# Patient Record
Sex: Male | Born: 1990 | Race: White | Hispanic: No | Marital: Single | Smoking: Current every day smoker
Health system: Southern US, Community
[De-identification: ages and names within clinical notes are randomized; demographics above are authoritative.]

## PROBLEM LIST (undated history)

## (undated) DIAGNOSIS — F419 Anxiety disorder, unspecified: Secondary | ICD-10-CM

## (undated) DIAGNOSIS — J45909 Unspecified asthma, uncomplicated: Secondary | ICD-10-CM

## (undated) DIAGNOSIS — S7290XA Unspecified fracture of unspecified femur, initial encounter for closed fracture: Secondary | ICD-10-CM

---

## 2013-06-18 ENCOUNTER — Emergency Department (HOSPITAL_COMMUNITY): Payer: 59

## 2013-06-18 ENCOUNTER — Encounter (HOSPITAL_COMMUNITY): Admission: EM | Disposition: A | Discharge status: Home / Self Care | Payer: Self-pay | Source: Home / Self Care

## 2013-06-18 ENCOUNTER — Inpatient Hospital Stay (HOSPITAL_COMMUNITY): Payer: 59 | Admitting: Anesthesiology

## 2013-06-18 ENCOUNTER — Encounter (HOSPITAL_COMMUNITY): Payer: 59 | Admitting: Anesthesiology

## 2013-06-18 ENCOUNTER — Inpatient Hospital Stay (HOSPITAL_COMMUNITY): Payer: 59

## 2013-06-18 ENCOUNTER — Inpatient Hospital Stay (HOSPITAL_COMMUNITY)
Admission: EM | Admit: 2013-06-18 | Discharge: 2013-06-20 | DRG: 956 | Disposition: A | Discharge status: Home / Self Care | Payer: 59 | Attending: General Surgery | Admitting: General Surgery

## 2013-06-18 ENCOUNTER — Encounter (HOSPITAL_COMMUNITY): Payer: Self-pay | Admitting: Emergency Medicine

## 2013-06-18 DIAGNOSIS — S82209A Unspecified fracture of shaft of unspecified tibia, initial encounter for closed fracture: Secondary | ICD-10-CM | POA: Diagnosis present

## 2013-06-18 DIAGNOSIS — S7290XA Unspecified fracture of unspecified femur, initial encounter for closed fracture: Secondary | ICD-10-CM

## 2013-06-18 DIAGNOSIS — D62 Acute posthemorrhagic anemia: Secondary | ICD-10-CM | POA: Diagnosis not present

## 2013-06-18 DIAGNOSIS — S060X9A Concussion with loss of consciousness of unspecified duration, initial encounter: Secondary | ICD-10-CM | POA: Diagnosis present

## 2013-06-18 DIAGNOSIS — S060XAA Concussion with loss of consciousness status unknown, initial encounter: Secondary | ICD-10-CM | POA: Diagnosis present

## 2013-06-18 DIAGNOSIS — F1010 Alcohol abuse, uncomplicated: Secondary | ICD-10-CM | POA: Diagnosis present

## 2013-06-18 DIAGNOSIS — S27329A Contusion of lung, unspecified, initial encounter: Secondary | ICD-10-CM | POA: Diagnosis present

## 2013-06-18 DIAGNOSIS — M79609 Pain in unspecified limb: Secondary | ICD-10-CM | POA: Diagnosis present

## 2013-06-18 DIAGNOSIS — S0100XA Unspecified open wound of scalp, initial encounter: Secondary | ICD-10-CM | POA: Diagnosis present

## 2013-06-18 DIAGNOSIS — S0083XA Contusion of other part of head, initial encounter: Secondary | ICD-10-CM | POA: Diagnosis present

## 2013-06-18 DIAGNOSIS — S7292XA Unspecified fracture of left femur, initial encounter for closed fracture: Secondary | ICD-10-CM | POA: Diagnosis present

## 2013-06-18 DIAGNOSIS — S0003XA Contusion of scalp, initial encounter: Secondary | ICD-10-CM | POA: Diagnosis present

## 2013-06-18 DIAGNOSIS — S1093XA Contusion of unspecified part of neck, initial encounter: Secondary | ICD-10-CM

## 2013-06-18 DIAGNOSIS — F10929 Alcohol use, unspecified with intoxication, unspecified: Secondary | ICD-10-CM | POA: Diagnosis present

## 2013-06-18 DIAGNOSIS — IMO0002 Reserved for concepts with insufficient information to code with codable children: Secondary | ICD-10-CM | POA: Diagnosis present

## 2013-06-18 DIAGNOSIS — S27322A Contusion of lung, bilateral, initial encounter: Secondary | ICD-10-CM | POA: Diagnosis present

## 2013-06-18 DIAGNOSIS — S0081XA Abrasion of other part of head, initial encounter: Secondary | ICD-10-CM | POA: Diagnosis present

## 2013-06-18 HISTORY — DX: Anxiety disorder, unspecified: F41.9

## 2013-06-18 HISTORY — DX: Unspecified fracture of unspecified femur, initial encounter for closed fracture: S72.90XA

## 2013-06-18 HISTORY — DX: Unspecified asthma, uncomplicated: J45.909

## 2013-06-18 HISTORY — PX: INTRAMEDULLARY (IM) NAIL INTERTROCHANTERIC: SHX5875

## 2013-06-18 LAB — URINALYSIS, ROUTINE W REFLEX MICROSCOPIC
Bilirubin Urine: NEGATIVE
GLUCOSE, UA: NEGATIVE mg/dL
Ketones, ur: NEGATIVE mg/dL
Leukocytes, UA: NEGATIVE
Nitrite: NEGATIVE
Protein, ur: 100 mg/dL — AB
Specific Gravity, Urine: 1.02 (ref 1.005–1.030)
Urobilinogen, UA: 0.2 mg/dL (ref 0.0–1.0)
pH: 6 (ref 5.0–8.0)

## 2013-06-18 LAB — I-STAT CHEM 8, ED
BUN: 14 mg/dL (ref 6–23)
CREATININE: 1.6 mg/dL — AB (ref 0.50–1.35)
Calcium, Ion: 1.08 mmol/L — ABNORMAL LOW (ref 1.12–1.23)
Chloride: 102 mEq/L (ref 96–112)
GLUCOSE: 95 mg/dL (ref 70–99)
HCT: 48 % (ref 39.0–52.0)
HEMOGLOBIN: 16.3 g/dL (ref 13.0–17.0)
Potassium: 3.2 mEq/L — ABNORMAL LOW (ref 3.7–5.3)
Sodium: 142 mEq/L (ref 137–147)
TCO2: 25 mmol/L (ref 0–100)

## 2013-06-18 LAB — TYPE AND SCREEN
ABO/RH(D): A POS
Antibody Screen: NEGATIVE

## 2013-06-18 LAB — ABO/RH: ABO/RH(D): A POS

## 2013-06-18 LAB — SURGICAL PCR SCREEN
MRSA, PCR: NEGATIVE
Staphylococcus aureus: NEGATIVE

## 2013-06-18 LAB — COMPREHENSIVE METABOLIC PANEL
ALK PHOS: 72 U/L (ref 39–117)
ALT: 100 U/L — ABNORMAL HIGH (ref 0–53)
AST: 160 U/L — ABNORMAL HIGH (ref 0–37)
Albumin: 4 g/dL (ref 3.5–5.2)
BILIRUBIN TOTAL: 0.3 mg/dL (ref 0.3–1.2)
BUN: 13 mg/dL (ref 6–23)
CHLORIDE: 103 meq/L (ref 96–112)
CO2: 23 meq/L (ref 19–32)
Calcium: 8.7 mg/dL (ref 8.4–10.5)
Creatinine, Ser: 1.1 mg/dL (ref 0.50–1.35)
Glucose, Bld: 88 mg/dL (ref 70–99)
Potassium: 3.5 mEq/L — ABNORMAL LOW (ref 3.7–5.3)
Sodium: 143 mEq/L (ref 137–147)
TOTAL PROTEIN: 6.9 g/dL (ref 6.0–8.3)

## 2013-06-18 LAB — CBC
HCT: 43.4 % (ref 39.0–52.0)
HCT: 33.4 % — ABNORMAL LOW (ref 39.0–52.0)
HEMOGLOBIN: 11.4 g/dL — AB (ref 13.0–17.0)
Hemoglobin: 15.2 g/dL (ref 13.0–17.0)
MCH: 32 pg (ref 26.0–34.0)
MCH: 31.1 pg (ref 26.0–34.0)
MCHC: 35 g/dL (ref 30.0–36.0)
MCHC: 34.1 g/dL (ref 30.0–36.0)
MCV: 91.4 fL (ref 78.0–100.0)
MCV: 91.3 fL (ref 78.0–100.0)
Platelets: 237 10*3/uL (ref 150–400)
Platelets: 163 10*3/uL (ref 150–400)
RBC: 4.75 MIL/uL (ref 4.22–5.81)
RBC: 3.66 MIL/uL — AB (ref 4.22–5.81)
RDW: 11.8 % (ref 11.5–15.5)
RDW: 12 % (ref 11.5–15.5)
WBC: 15.8 10*3/uL — ABNORMAL HIGH (ref 4.0–10.5)
WBC: 8.3 10*3/uL (ref 4.0–10.5)

## 2013-06-18 LAB — URINE MICROSCOPIC-ADD ON

## 2013-06-18 LAB — PROTIME-INR
INR: 0.99 (ref 0.00–1.49)
Prothrombin Time: 12.9 seconds (ref 11.6–15.2)

## 2013-06-18 LAB — CREATININE, SERUM
CREATININE: 0.78 mg/dL (ref 0.50–1.35)
GFR calc non Af Amer: >90 mL/min (ref >90)

## 2013-06-18 LAB — ETHANOL: Alcohol, Ethyl (B): 309 mg/dL — ABNORMAL HIGH (ref 0–11)

## 2013-06-18 LAB — CDS SEROLOGY

## 2013-06-18 SURGERY — FIXATION, FRACTURE, INTERTROCHANTERIC, WITH INTRAMEDULLARY ROD
Anesthesia: General | Site: Leg Upper | Laterality: Left

## 2013-06-18 MED ORDER — GLYCOPYRROLATE 0.2 MG/ML IJ SOLN
INTRAMUSCULAR | Status: DC | PRN
  Administered 2013-06-18: 0.6 mg via INTRAVENOUS

## 2013-06-18 MED ORDER — ALBUTEROL SULFATE (2.5 MG/3ML) 0.083% IN NEBU: 3.0000 mL | INHALATION_SOLUTION | Freq: Four times a day (QID) | RESPIRATORY_TRACT | Status: DC | PRN

## 2013-06-18 MED ORDER — FENTANYL CITRATE 0.05 MG/ML IJ SOLN
INTRAMUSCULAR | Status: AC
  Filled 2013-06-18: qty 5

## 2013-06-18 MED ORDER — ONDANSETRON HCL 4 MG/2ML IJ SOLN
INTRAMUSCULAR | Status: DC | PRN
  Administered 2013-06-18: 4 mg via INTRAVENOUS

## 2013-06-18 MED ORDER — HYDROMORPHONE HCL PF 1 MG/ML IJ SOLN
1.0000 mg | Freq: Once | INTRAMUSCULAR | Status: AC
  Administered 2013-06-18: 1 mg via INTRAVENOUS
  Filled 2013-06-18: qty 1

## 2013-06-18 MED ORDER — SODIUM CHLORIDE 0.9 % IV SOLN
INTRAVENOUS | Status: DC
  Administered 2013-06-18: 18:00:00 via INTRAVENOUS

## 2013-06-18 MED ORDER — NEOSTIGMINE METHYLSULFATE 1 MG/ML IJ SOLN
INTRAMUSCULAR | Status: AC
  Filled 2013-06-18: qty 10

## 2013-06-18 MED ORDER — TETANUS-DIPHTHERIA TOXOIDS TD 5-2 LFU IM INJ: 0.5000 mL | INJECTION | Freq: Once | INTRAMUSCULAR | Status: DC

## 2013-06-18 MED ORDER — ALBUMIN HUMAN 5 % IV SOLN
INTRAVENOUS | Status: DC | PRN
  Administered 2013-06-18 (×2): via INTRAVENOUS

## 2013-06-18 MED ORDER — LORAZEPAM 2 MG/ML IJ SOLN: 1.0000 mg | Freq: Four times a day (QID) | INTRAMUSCULAR | Status: DC | PRN

## 2013-06-18 MED ORDER — DIPHENHYDRAMINE HCL 12.5 MG/5ML PO ELIX: 25.0000 mg | ORAL_SOLUTION | ORAL | Status: DC | PRN

## 2013-06-18 MED ORDER — GLYCOPYRROLATE 0.2 MG/ML IJ SOLN
INTRAMUSCULAR | Status: AC
  Filled 2013-06-18: qty 3

## 2013-06-18 MED ORDER — POLYETHYLENE GLYCOL 3350 17 G PO PACK: 17.0000 g | PACK | Freq: Every day | ORAL | Status: DC | PRN

## 2013-06-18 MED ORDER — LORAZEPAM 2 MG/ML IJ SOLN: 0.0000 mg | Freq: Four times a day (QID) | INTRAMUSCULAR | Status: DC

## 2013-06-18 MED ORDER — CEFAZOLIN SODIUM-DEXTROSE 2-3 GM-% IV SOLR
INTRAVENOUS | Status: DC | PRN
  Administered 2013-06-18: 2 g via INTRAVENOUS

## 2013-06-18 MED ORDER — OXYCODONE HCL 5 MG PO TABS
5.0000 mg | ORAL_TABLET | Freq: Once | ORAL | Status: AC | PRN
  Administered 2013-06-18: 5 mg via ORAL

## 2013-06-18 MED ORDER — ONDANSETRON HCL 4 MG/2ML IJ SOLN: 4.0000 mg | Freq: Four times a day (QID) | INTRAMUSCULAR | Status: DC | PRN

## 2013-06-18 MED ORDER — LORAZEPAM 2 MG/ML IJ SOLN: 0.0000 mg | Freq: Two times a day (BID) | INTRAMUSCULAR | Status: DC

## 2013-06-18 MED ORDER — OXYCODONE HCL 5 MG/5ML PO SOLN: 5.0000 mg | Freq: Once | ORAL | Status: AC | PRN

## 2013-06-18 MED ORDER — METHOCARBAMOL 100 MG/ML IJ SOLN
500.0000 mg | Freq: Four times a day (QID) | INTRAVENOUS | Status: DC | PRN
  Filled 2013-06-18: qty 5

## 2013-06-18 MED ORDER — HYDROMORPHONE HCL PF 1 MG/ML IJ SOLN
INTRAMUSCULAR | Status: AC
  Filled 2013-06-18: qty 1

## 2013-06-18 MED ORDER — METOCLOPRAMIDE HCL 5 MG PO TABS
5.0000 mg | ORAL_TABLET | Freq: Three times a day (TID) | ORAL | Status: DC | PRN
  Filled 2013-06-18: qty 2

## 2013-06-18 MED ORDER — ENOXAPARIN SODIUM 40 MG/0.4ML ~~LOC~~ SOLN
40.0000 mg | SUBCUTANEOUS | Status: DC
  Administered 2013-06-18: 40 mg via SUBCUTANEOUS
  Filled 2013-06-18 (×3): qty 0.4

## 2013-06-18 MED ORDER — 0.9 % SODIUM CHLORIDE (POUR BTL) OPTIME
TOPICAL | Status: DC | PRN
  Administered 2013-06-18: 1000 mL

## 2013-06-18 MED ORDER — ROCURONIUM BROMIDE 50 MG/5ML IV SOLN
INTRAVENOUS | Status: AC
  Filled 2013-06-18: qty 1

## 2013-06-18 MED ORDER — MORPHINE SULFATE 2 MG/ML IJ SOLN
1.0000 mg | INTRAMUSCULAR | Status: DC | PRN
  Administered 2013-06-18 – 2013-06-19 (×4): 1 mg via INTRAVENOUS
  Filled 2013-06-18 (×4): qty 1

## 2013-06-18 MED ORDER — OXYCODONE HCL 5 MG PO TABS
5.0000 mg | ORAL_TABLET | ORAL | Status: DC | PRN
Start: 2013-06-18 — End: 2013-06-19
  Administered 2013-06-18 – 2013-06-19 (×2): 10 mg via ORAL
  Filled 2013-06-18 (×2): qty 2

## 2013-06-18 MED ORDER — ENOXAPARIN SODIUM 40 MG/0.4ML ~~LOC~~ SOLN: 40.0000 mg | Freq: Every day | SUBCUTANEOUS | Status: DC

## 2013-06-18 MED ORDER — ROCURONIUM BROMIDE 100 MG/10ML IV SOLN
INTRAVENOUS | Status: DC | PRN
  Administered 2013-06-18: 50 mg via INTRAVENOUS
  Administered 2013-06-18: 20 mg via INTRAVENOUS

## 2013-06-18 MED ORDER — ONDANSETRON HCL 4 MG/2ML IJ SOLN
INTRAMUSCULAR | Status: AC
  Filled 2013-06-18: qty 2

## 2013-06-18 MED ORDER — FENTANYL CITRATE 0.05 MG/ML IJ SOLN
INTRAMUSCULAR | Status: DC | PRN
  Administered 2013-06-18 (×5): 50 ug via INTRAVENOUS

## 2013-06-18 MED ORDER — METHOCARBAMOL 500 MG PO TABS
500.0000 mg | ORAL_TABLET | Freq: Four times a day (QID) | ORAL | Status: DC | PRN
Start: 2013-06-18 — End: 2013-06-20
  Administered 2013-06-18 – 2013-06-20 (×6): 500 mg via ORAL
  Filled 2013-06-18 (×6): qty 1

## 2013-06-18 MED ORDER — PANTOPRAZOLE SODIUM 40 MG IV SOLR
40.0000 mg | Freq: Every day | INTRAVENOUS | Status: DC
  Administered 2013-06-18: 40 mg via INTRAVENOUS
  Filled 2013-06-18 (×2): qty 40

## 2013-06-18 MED ORDER — NEOSTIGMINE METHYLSULFATE 1 MG/ML IJ SOLN
INTRAMUSCULAR | Status: DC | PRN
  Administered 2013-06-18: 4 mg via INTRAVENOUS

## 2013-06-18 MED ORDER — PROMETHAZINE HCL 25 MG/ML IJ SOLN: 6.2500 mg | INTRAMUSCULAR | Status: DC | PRN

## 2013-06-18 MED ORDER — LACTATED RINGERS IV SOLN
INTRAVENOUS | Status: DC | PRN
  Administered 2013-06-18 (×2): via INTRAVENOUS

## 2013-06-18 MED ORDER — METOCLOPRAMIDE HCL 5 MG/ML IJ SOLN: 5.0000 mg | Freq: Three times a day (TID) | INTRAMUSCULAR | Status: DC | PRN

## 2013-06-18 MED ORDER — MIDAZOLAM HCL 2 MG/2ML IJ SOLN
INTRAMUSCULAR | Status: AC
  Filled 2013-06-18: qty 2

## 2013-06-18 MED ORDER — ONDANSETRON HCL 4 MG/2ML IJ SOLN
INTRAMUSCULAR | Status: AC
  Administered 2013-06-18: 4 mg
  Filled 2013-06-18: qty 2

## 2013-06-18 MED ORDER — SENNA 8.6 MG PO TABS
1.0000 | ORAL_TABLET | Freq: Two times a day (BID) | ORAL | Status: DC
  Administered 2013-06-18 – 2013-06-20 (×4): 8.6 mg via ORAL
  Filled 2013-06-18 (×5): qty 1

## 2013-06-18 MED ORDER — PANTOPRAZOLE SODIUM 40 MG PO TBEC: 40.0000 mg | DELAYED_RELEASE_TABLET | Freq: Every day | ORAL | Status: DC

## 2013-06-18 MED ORDER — ONDANSETRON HCL 4 MG PO TABS: 4.0000 mg | ORAL_TABLET | Freq: Four times a day (QID) | ORAL | Status: DC | PRN

## 2013-06-18 MED ORDER — IOHEXOL 300 MG/ML  SOLN
100.0000 mL | Freq: Once | INTRAMUSCULAR | Status: AC | PRN
  Administered 2013-06-18: 100 mL via INTRAVENOUS

## 2013-06-18 MED ORDER — HYDROCODONE-ACETAMINOPHEN 5-325 MG PO TABS: 1.0000 | ORAL_TABLET | ORAL | Status: DC | PRN

## 2013-06-18 MED ORDER — FOLIC ACID 1 MG PO TABS
1.0000 mg | ORAL_TABLET | Freq: Every day | ORAL | Status: DC
  Administered 2013-06-19 – 2013-06-20 (×2): 1 mg via ORAL
  Filled 2013-06-18 (×3): qty 1

## 2013-06-18 MED ORDER — LIDOCAINE HCL (CARDIAC) 20 MG/ML IV SOLN
INTRAVENOUS | Status: DC | PRN
  Administered 2013-06-18: 100 mg via INTRAVENOUS

## 2013-06-18 MED ORDER — PROPOFOL 10 MG/ML IV BOLUS
INTRAVENOUS | Status: AC
Start: 2013-06-18 — End: 2013-06-18
  Filled 2013-06-18: qty 20

## 2013-06-18 MED ORDER — OXYCODONE HCL 5 MG PO TABS: 5.0000 mg | ORAL_TABLET | ORAL | Status: DC | PRN

## 2013-06-18 MED ORDER — CLONAZEPAM 0.5 MG PO TABS: 0.5000 mg | ORAL_TABLET | Freq: Three times a day (TID) | ORAL | Status: DC | PRN

## 2013-06-18 MED ORDER — PROPOFOL 10 MG/ML IV BOLUS
INTRAVENOUS | Status: DC | PRN
  Administered 2013-06-18: 130 mg via INTRAVENOUS

## 2013-06-18 MED ORDER — FENTANYL CITRATE 0.05 MG/ML IJ SOLN
50.0000 ug | Freq: Once | INTRAMUSCULAR | Status: AC
  Administered 2013-06-18: 50 ug via INTRAVENOUS

## 2013-06-18 MED ORDER — OXYCODONE HCL 5 MG PO TABS
ORAL_TABLET | ORAL | Status: AC
  Filled 2013-06-18: qty 1

## 2013-06-18 MED ORDER — HYDROMORPHONE HCL PF 1 MG/ML IJ SOLN
1.0000 mg | INTRAMUSCULAR | Status: DC | PRN
  Administered 2013-06-18 (×2): 1 mg via INTRAVENOUS
  Filled 2013-06-18: qty 1
  Filled 2013-06-18: qty 2

## 2013-06-18 MED ORDER — FENTANYL CITRATE 0.05 MG/ML IJ SOLN
INTRAMUSCULAR | Status: AC
  Administered 2013-06-18: 50 ug via INTRAVENOUS
  Filled 2013-06-18: qty 2

## 2013-06-18 MED ORDER — KCL IN DEXTROSE-NACL 20-5-0.45 MEQ/L-%-% IV SOLN
INTRAVENOUS | Status: DC
  Administered 2013-06-18: 11:00:00 via INTRAVENOUS
  Filled 2013-06-18 (×5): qty 1000

## 2013-06-18 MED ORDER — ADULT MULTIVITAMIN W/MINERALS CH
1.0000 | ORAL_TABLET | Freq: Every day | ORAL | Status: DC
  Administered 2013-06-19 – 2013-06-20 (×2): 1 via ORAL
  Filled 2013-06-18 (×3): qty 1

## 2013-06-18 MED ORDER — VITAMIN B-1 100 MG PO TABS
100.0000 mg | ORAL_TABLET | Freq: Every day | ORAL | Status: DC
  Administered 2013-06-19 – 2013-06-20 (×2): 100 mg via ORAL
  Filled 2013-06-18 (×3): qty 1

## 2013-06-18 MED ORDER — TETANUS-DIPHTHERIA TOXOIDS TD 5-2 LFU IM INJ
0.5000 mL | INJECTION | Freq: Once | INTRAMUSCULAR | Status: DC
  Filled 2013-06-18: qty 0.5

## 2013-06-18 MED ORDER — CEFAZOLIN SODIUM-DEXTROSE 2-3 GM-% IV SOLR
2.0000 g | Freq: Four times a day (QID) | INTRAVENOUS | Status: AC
  Administered 2013-06-18 – 2013-06-19 (×3): 2 g via INTRAVENOUS
  Filled 2013-06-18 (×4): qty 50

## 2013-06-18 MED ORDER — PROPOFOL 10 MG/ML IV BOLUS
INTRAVENOUS | Status: AC
  Filled 2013-06-18: qty 20

## 2013-06-18 MED ORDER — SODIUM CHLORIDE 0.9 % IV BOLUS (SEPSIS)
500.0000 mL | Freq: Once | INTRAVENOUS | Status: AC
  Administered 2013-06-18: 500 mL via INTRAVENOUS

## 2013-06-18 MED ORDER — SORBITOL 70 % SOLN: 30.0000 mL | Freq: Every day | Status: DC | PRN

## 2013-06-18 MED ORDER — LORAZEPAM 1 MG PO TABS
1.0000 mg | ORAL_TABLET | Freq: Four times a day (QID) | ORAL | Status: DC | PRN
  Administered 2013-06-20: 1 mg via ORAL
  Filled 2013-06-18: qty 1

## 2013-06-18 MED ORDER — HYDROMORPHONE HCL PF 1 MG/ML IJ SOLN
0.2500 mg | INTRAMUSCULAR | Status: DC | PRN
  Administered 2013-06-18 (×4): 0.5 mg via INTRAVENOUS

## 2013-06-18 MED ORDER — MAGNESIUM CITRATE PO SOLN: 1.0000 | Freq: Once | ORAL | Status: AC | PRN

## 2013-06-18 MED ORDER — DIAZEPAM 5 MG PO TABS
5.0000 mg | ORAL_TABLET | Freq: Every day | ORAL | Status: DC | PRN
  Administered 2013-06-18: 5 mg via ORAL
  Filled 2013-06-18: qty 1

## 2013-06-18 MED ORDER — MIDAZOLAM HCL 5 MG/5ML IJ SOLN
INTRAMUSCULAR | Status: DC | PRN
  Administered 2013-06-18: 2 mg via INTRAVENOUS

## 2013-06-18 MED ORDER — THIAMINE HCL 100 MG/ML IJ SOLN
100.0000 mg | Freq: Every day | INTRAMUSCULAR | Status: DC
  Administered 2013-06-18: 100 mg via INTRAVENOUS
  Filled 2013-06-18 (×2): qty 1

## 2013-06-18 MED ORDER — TETANUS-DIPHTH-ACELL PERTUSSIS 5-2.5-18.5 LF-MCG/0.5 IM SUSP
0.5000 mL | Freq: Once | INTRAMUSCULAR | Status: AC
  Administered 2013-06-18: 0.5 mL via INTRAMUSCULAR

## 2013-06-18 SURGICAL SUPPLY — 47 items
BANDAGE GAUZE ELAST BULKY 4 IN (GAUZE/BANDAGES/DRESSINGS) ×3 IMPLANT
BIT DRILL SHORT 4.0 (BIT) ×2 IMPLANT
BLADE SURG 15 STRL LF DISP TIS (BLADE) ×1 IMPLANT
BLADE SURG 15 STRL SS (BLADE) ×2
BNDG COHESIVE 4X5 TAN NS LF (GAUZE/BANDAGES/DRESSINGS) ×3 IMPLANT
COVER SURGICAL LIGHT HANDLE (MISCELLANEOUS) ×3 IMPLANT
DRAPE PROXIMA HALF (DRAPES) IMPLANT
DRAPE STERI IOBAN 125X83 (DRAPES) ×3 IMPLANT
DRILL BIT SHORT 4.0 (BIT) ×4
DRSG MEPILEX BORDER 4X4 (GAUZE/BANDAGES/DRESSINGS) ×6 IMPLANT
DRSG MEPILEX BORDER 4X8 (GAUZE/BANDAGES/DRESSINGS) ×3 IMPLANT
DRSG PAD ABDOMINAL 8X10 ST (GAUZE/BANDAGES/DRESSINGS) ×6 IMPLANT
DURAPREP 26ML APPLICATOR (WOUND CARE) ×3 IMPLANT
ELECT CAUTERY BLADE 6.4 (BLADE) ×3 IMPLANT
ELECT REM PT RETURN 9FT ADLT (ELECTROSURGICAL) ×3
ELECTRODE REM PT RTRN 9FT ADLT (ELECTROSURGICAL) ×1 IMPLANT
FACESHIELD WRAPAROUND (MASK) ×3 IMPLANT
GAUZE XEROFORM 5X9 LF (GAUZE/BANDAGES/DRESSINGS) ×3 IMPLANT
GLOVE SURG SS PI 7.5 STRL IVOR (GLOVE) ×6 IMPLANT
GOWN STRL REUS W/ TWL LRG LVL3 (GOWN DISPOSABLE) ×1 IMPLANT
GOWN STRL REUS W/ TWL XL LVL3 (GOWN DISPOSABLE) ×1 IMPLANT
GOWN STRL REUS W/TWL LRG LVL3 (GOWN DISPOSABLE) ×2
GOWN STRL REUS W/TWL XL LVL3 (GOWN DISPOSABLE) ×2
GUIDE PIN 3.2MM (MISCELLANEOUS) ×2
GUIDE PIN ORTH 343X3.2XBRAD (MISCELLANEOUS) ×1 IMPLANT
GUIDE ROD 3.0 (MISCELLANEOUS) ×3
KIT BASIN OR (CUSTOM PROCEDURE TRAY) ×3 IMPLANT
KIT ROOM TURNOVER OR (KITS) ×3 IMPLANT
MANIFOLD NEPTUNE II (INSTRUMENTS) ×3 IMPLANT
NS IRRIG 1000ML POUR BTL (IV SOLUTION) ×3 IMPLANT
PACK GENERAL/GYN (CUSTOM PROCEDURE TRAY) ×3 IMPLANT
PAD ARMBOARD 7.5X6 YLW CONV (MISCELLANEOUS) ×6 IMPLANT
PAD CAST 4YDX4 CTTN HI CHSV (CAST SUPPLIES) ×2 IMPLANT
PADDING CAST COTTON 4X4 STRL (CAST SUPPLIES) ×4
ROD GUIDE 3.0 (MISCELLANEOUS) ×1 IMPLANT
SCREW TRIGEN LOW PROF 5.0X50 (Screw) ×3 IMPLANT
SCREW TRIGEN LOW PROF 5.0X65 (Screw) ×3 IMPLANT
SCREW TRIGEN LOW PROF 5.0X72.5 (Screw) ×3 IMPLANT
STAPLER VISISTAT 35W (STAPLE) ×3 IMPLANT
SUT VIC AB 0 CT1 27 (SUTURE) ×4
SUT VIC AB 0 CT1 27XBRD ANBCTR (SUTURE) ×2 IMPLANT
SUT VIC AB 2-0 CT1 27 (SUTURE) ×4
SUT VIC AB 2-0 CT1 TAPERPNT 27 (SUTURE) ×2 IMPLANT
TOWEL OR 17X24 6PK STRL BLUE (TOWEL DISPOSABLE) ×3 IMPLANT
TOWEL OR 17X26 10 PK STRL BLUE (TOWEL DISPOSABLE) ×3 IMPLANT
TROCHNAIL ANTIGRADE 130 10X42 (Orthopedic Implant) ×3 IMPLANT
WATER STERILE IRR 1000ML POUR (IV SOLUTION) ×3 IMPLANT
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
## 2013-06-18 NOTE — Progress Notes (Signed)
Orthopedic Tech Progress Note Patient Details:  John BusingKevin Hudson 1991/02/08 [id number] Applied overhead frame to bed     John Moccasinnthony Hudson John Hudson 06/18/2013, 8:03 PM
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
## 2013-06-18 NOTE — Progress Notes (Signed)
Orthopedic Tech Progress Note Patient Details:  Hall BusingKevin Rochette 1990-04-28 [id number]      Haskell FlirtCorey M Lazar Tierce 06/18/2013, 5:46 AM

## 2013-06-18 NOTE — H&P (Signed)
H&P update  The surgical history has been reviewed and remains accurate without interval change.  The patient was re-examined and patient's physiologic condition has not changed significantly in the last 30 days. The condition still exists that makes this procedure necessary. The treatment plan remains the same, without new options for care.  No new pharmacological allergies or types of therapy has been initiated that would change the plan or the appropriateness of the plan.  The patient and/or family understand the potential benefits and risks.  Mayra ReelN. Michael Clova Morlock, MD 06/18/2013 12:18 PM
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
## 2013-06-18 NOTE — ED Notes (Signed)
WARM BLANKETS GIVEN TO PT.

## 2013-06-18 NOTE — Anesthesia Procedure Notes (Signed)
Procedure Name: Intubation Date/Time: 06/18/2013 1:58 PM Performed by: Brien MatesMAHONY, Shemeca Lukasik D Pre-anesthesia Checklist: Patient identified, Emergency Drugs available, Suction available, Patient being monitored and Timeout performed Patient Re-evaluated:Patient Re-evaluated prior to inductionOxygen Delivery Method: Circle system utilized Preoxygenation: Pre-oxygenation with 100% oxygen Intubation Type: IV induction Ventilation: Mask ventilation without difficulty Laryngoscope Size: Miller and 2 Grade View: Grade I Tube type: Oral Tube size: 7.5 mm Number of attempts: 1 Airway Equipment and Method: Stylet Placement Confirmation: ETT inserted through vocal cords under direct vision,  positive ETCO2 and breath sounds checked- equal and bilateral Secured at: 22 cm Tube secured with: Tape Dental Injury: Teeth and Oropharynx as per pre-operative assessment
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
## 2013-06-18 NOTE — ED Notes (Signed)
[facility]

## 2013-06-18 NOTE — Anesthesia Preprocedure Evaluation (Addendum)
Anesthesia Evaluation  Patient identified by MRN, date of birth, ID band Patient awake    Reviewed: Allergy & Precautions, H&P , NPO status , Patient's Chart, lab work & pertinent test results  Airway Mallampati: II TM Distance: >3 FB Neck ROM: full    Dental no notable dental hx. (+) Teeth Intact, Dental Advidsory Given   Pulmonary neg pulmonary ROS, asthma , Current Smoker,  breath sounds clear to auscultation        Cardiovascular negative cardio ROS  Rhythm:regular Rate:Normal     Neuro/Psych negative neurological ROS  negative psych ROS   GI/Hepatic negative GI ROS, Neg liver ROS,   Endo/Other  negative endocrine ROS  Renal/GU negative Renal ROS     Musculoskeletal   Abdominal   Peds  Hematology   Anesthesia Other Findings   Reproductive/Obstetrics negative OB ROS                          Anesthesia Physical Anesthesia Plan  ASA: II  Anesthesia Plan: General ETT   Post-op Pain Management:    Induction: Intravenous  Airway Management Planned: Oral ETT  Additional Equipment:   Intra-op Plan:   Post-operative Plan:   Informed Consent: I have reviewed the patients History and Physical, chart, labs and discussed the procedure including the risks, benefits and alternatives for the proposed anesthesia with the patient or authorized representative who has indicated his/her understanding and acceptance.   Dental Advisory Given and Dental advisory given  Plan Discussed with: Anesthesiologist, CRNA and Surgeon  Anesthesia Plan Comments:        Anesthesia Quick Evaluation

## 2013-06-18 NOTE — Progress Notes (Signed)
Orthopedic Tech Progress Note Patient Details:  Hall BusingKevin Repka 1990-04-21 [id number] Traction weight changed from 5lbs to 10lbs Musculoskeletal Traction Type of Traction: Bucks Skin Traction Traction Location: right leg Traction Weight: 10 lbs    [place] R Thompson 06/18/2013, 9:58 AM
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
## 2013-06-18 NOTE — Progress Notes (Signed)
UR completed.  Kayliegh Boyers, RN BSN [facility] CCM Trauma/Neuro ICU Case Manager [phone]  

## 2013-06-18 NOTE — ED Notes (Signed)
DR. Laurell JosephsBURKE THOMPSON AT BEDSIDE EVALUATING PT.

## 2013-06-18 NOTE — Transfer of Care (Signed)
Immediate Anesthesia Transfer of Care Note  Patient: John Hudson  Procedure(s) Performed: Procedure(s): INTRAMEDULLARY (IM) NAIL FEMORAL (Left)  Patient Location: PACU  Anesthesia Type:General  Level of Consciousness: awake  Airway & Oxygen Therapy: Patient Spontanous Breathing and Patient connected to face mask oxygen  Post-op Assessment: Report given to PACU RN and Post -op Vital signs reviewed and stable  Post vital signs: Reviewed and stable  Complications: No apparent anesthesia complications

## 2013-06-18 NOTE — ED Notes (Signed)
TRANSPORTED TO CT SCAN.  

## 2013-06-18 NOTE — Progress Notes (Signed)
Surgery scheduled for 1 pm today.

## 2013-06-19 ENCOUNTER — Inpatient Hospital Stay (HOSPITAL_COMMUNITY): Payer: 59

## 2013-06-19 ENCOUNTER — Encounter (HOSPITAL_COMMUNITY): Payer: 59 | Admitting: Anesthesiology

## 2013-06-19 ENCOUNTER — Encounter (HOSPITAL_COMMUNITY): Payer: Self-pay | Admitting: Certified Registered"

## 2013-06-19 ENCOUNTER — Encounter (HOSPITAL_COMMUNITY): Admission: EM | Disposition: A | Discharge status: Home / Self Care | Payer: Self-pay | Source: Home / Self Care

## 2013-06-19 ENCOUNTER — Inpatient Hospital Stay (HOSPITAL_COMMUNITY): Payer: 59 | Admitting: Anesthesiology

## 2013-06-19 DIAGNOSIS — S060X9A Concussion with loss of consciousness of unspecified duration, initial encounter: Secondary | ICD-10-CM | POA: Diagnosis present

## 2013-06-19 DIAGNOSIS — D62 Acute posthemorrhagic anemia: Secondary | ICD-10-CM | POA: Diagnosis not present

## 2013-06-19 DIAGNOSIS — S060XAA Concussion with loss of consciousness status unknown, initial encounter: Secondary | ICD-10-CM | POA: Diagnosis present

## 2013-06-19 DIAGNOSIS — S7290XA Unspecified fracture of unspecified femur, initial encounter for closed fracture: Secondary | ICD-10-CM | POA: Diagnosis not present

## 2013-06-19 HISTORY — PX: TIBIA IM NAIL INSERTION: SHX2516

## 2013-06-19 LAB — CBC
HCT: 31.4 % — ABNORMAL LOW (ref 39.0–52.0)
Hemoglobin: 10.8 g/dL — ABNORMAL LOW (ref 13.0–17.0)
MCH: 31.1 pg (ref 26.0–34.0)
MCHC: 34.4 g/dL (ref 30.0–36.0)
MCV: 90.5 fL (ref 78.0–100.0)
PLATELETS: 154 10*3/uL (ref 150–400)
RBC: 3.47 MIL/uL — ABNORMAL LOW (ref 4.22–5.81)
RDW: 11.9 % (ref 11.5–15.5)
WBC: 6.8 10*3/uL (ref 4.0–10.5)

## 2013-06-19 LAB — COMPREHENSIVE METABOLIC PANEL
ALT: 54 U/L — ABNORMAL HIGH (ref 0–53)
AST: 87 U/L — AB (ref 0–37)
Albumin: 3.4 g/dL — ABNORMAL LOW (ref 3.5–5.2)
Alkaline Phosphatase: 56 U/L (ref 39–117)
BUN: 10 mg/dL (ref 6–23)
CALCIUM: 8.7 mg/dL (ref 8.4–10.5)
CO2: 27 mEq/L (ref 19–32)
Chloride: 100 mEq/L (ref 96–112)
Creatinine, Ser: 0.89 mg/dL (ref 0.50–1.35)
GFR calc Af Amer: >90 mL/min (ref >90)
Glucose, Bld: 117 mg/dL — ABNORMAL HIGH (ref 70–99)
Potassium: 4.7 mEq/L (ref 3.7–5.3)
Sodium: 139 mEq/L (ref 137–147)
Total Bilirubin: 1.1 mg/dL (ref 0.3–1.2)
Total Protein: 5.9 g/dL — ABNORMAL LOW (ref 6.0–8.3)

## 2013-06-19 SURGERY — INSERTION, INTRAMEDULLARY ROD, TIBIA
Anesthesia: General | Site: Leg Lower | Laterality: Left

## 2013-06-19 MED ORDER — SODIUM CHLORIDE 0.9 % IV SOLN
INTRAVENOUS | Status: DC
  Administered 2013-06-19 (×2): via INTRAVENOUS

## 2013-06-19 MED ORDER — GLYCOPYRROLATE 0.2 MG/ML IJ SOLN
INTRAMUSCULAR | Status: DC | PRN
  Administered 2013-06-19: 0.4 mg via INTRAVENOUS

## 2013-06-19 MED ORDER — MIDAZOLAM HCL 2 MG/2ML IJ SOLN
INTRAMUSCULAR | Status: AC
  Filled 2013-06-19: qty 2

## 2013-06-19 MED ORDER — HYDROGEN PEROXIDE 3 % EX SOLN
CUTANEOUS | Status: DC | PRN
  Administered 2013-06-19: 1

## 2013-06-19 MED ORDER — OXYCODONE HCL 5 MG PO TABS
5.0000 mg | ORAL_TABLET | Freq: Once | ORAL | Status: AC | PRN
  Administered 2013-06-19: 5 mg via ORAL

## 2013-06-19 MED ORDER — LIDOCAINE HCL (CARDIAC) 20 MG/ML IV SOLN
INTRAVENOUS | Status: DC | PRN
  Administered 2013-06-19: 100 mg via INTRAVENOUS

## 2013-06-19 MED ORDER — FENTANYL CITRATE 0.05 MG/ML IJ SOLN
INTRAMUSCULAR | Status: DC | PRN
  Administered 2013-06-19: 50 ug via INTRAVENOUS
  Administered 2013-06-19: 100 ug via INTRAVENOUS

## 2013-06-19 MED ORDER — MORPHINE SULFATE 2 MG/ML IJ SOLN
2.0000 mg | INTRAMUSCULAR | Status: DC | PRN
Start: 2013-06-19 — End: 2013-06-20
  Administered 2013-06-19 – 2013-06-20 (×4): 2 mg via INTRAVENOUS
  Filled 2013-06-19 (×3): qty 1

## 2013-06-19 MED ORDER — PROPOFOL 10 MG/ML IV BOLUS
INTRAVENOUS | Status: DC | PRN
  Administered 2013-06-19: 200 mg via INTRAVENOUS

## 2013-06-19 MED ORDER — LIDOCAINE HCL (CARDIAC) 20 MG/ML IV SOLN
INTRAVENOUS | Status: AC
  Filled 2013-06-19: qty 5

## 2013-06-19 MED ORDER — CEFAZOLIN SODIUM-DEXTROSE 2-3 GM-% IV SOLR
INTRAVENOUS | Status: DC | PRN
  Administered 2013-06-19: 2 g via INTRAVENOUS

## 2013-06-19 MED ORDER — HYDROCODONE-ACETAMINOPHEN 10-325 MG PO TABS
0.5000 | ORAL_TABLET | ORAL | Status: DC | PRN
  Administered 2013-06-19 – 2013-06-20 (×3): 2 via ORAL
  Filled 2013-06-19 (×5): qty 2

## 2013-06-19 MED ORDER — LACTATED RINGERS IV SOLN
INTRAVENOUS | Status: DC
  Administered 2013-06-19 (×2): via INTRAVENOUS

## 2013-06-19 MED ORDER — ENOXAPARIN SODIUM 40 MG/0.4ML ~~LOC~~ SOLN: 40.0000 mg | SUBCUTANEOUS | Status: DC

## 2013-06-19 MED ORDER — 0.9 % SODIUM CHLORIDE (POUR BTL) OPTIME
TOPICAL | Status: DC | PRN
  Administered 2013-06-19: 1000 mL

## 2013-06-19 MED ORDER — ROCURONIUM BROMIDE 100 MG/10ML IV SOLN
INTRAVENOUS | Status: DC | PRN
  Administered 2013-06-19: 50 mg via INTRAVENOUS

## 2013-06-19 MED ORDER — MORPHINE SULFATE 2 MG/ML IJ SOLN
INTRAMUSCULAR | Status: AC
  Filled 2013-06-19: qty 1

## 2013-06-19 MED ORDER — HYDROMORPHONE HCL PF 1 MG/ML IJ SOLN
0.5000 mg | INTRAMUSCULAR | Status: DC | PRN
  Filled 2013-06-19: qty 1

## 2013-06-19 MED ORDER — METHOCARBAMOL 500 MG PO TABS
ORAL_TABLET | ORAL | Status: AC
  Filled 2013-06-19: qty 1

## 2013-06-19 MED ORDER — PROPOFOL 10 MG/ML IV BOLUS
INTRAVENOUS | Status: AC
  Filled 2013-06-19: qty 20

## 2013-06-19 MED ORDER — OXYCODONE HCL 5 MG/5ML PO SOLN: 5.0000 mg | Freq: Once | ORAL | Status: AC | PRN

## 2013-06-19 MED ORDER — HYDROMORPHONE HCL PF 1 MG/ML IJ SOLN
0.2500 mg | INTRAMUSCULAR | Status: DC | PRN
  Administered 2013-06-19 (×4): 0.5 mg via INTRAVENOUS

## 2013-06-19 MED ORDER — NEOSTIGMINE METHYLSULFATE 1 MG/ML IJ SOLN
INTRAMUSCULAR | Status: DC | PRN
  Administered 2013-06-19: 3 mg via INTRAVENOUS

## 2013-06-19 MED ORDER — CEFAZOLIN SODIUM-DEXTROSE 2-3 GM-% IV SOLR
INTRAVENOUS | Status: AC
  Filled 2013-06-19: qty 50

## 2013-06-19 MED ORDER — BACITRACIN ZINC 500 UNIT/GM EX OINT
TOPICAL_OINTMENT | Freq: Two times a day (BID) | CUTANEOUS | Status: DC
  Filled 2013-06-19: qty 28.35

## 2013-06-19 MED ORDER — ONDANSETRON HCL 4 MG/2ML IJ SOLN
INTRAMUSCULAR | Status: DC | PRN
  Administered 2013-06-19: 4 mg via INTRAVENOUS

## 2013-06-19 MED ORDER — BACITRACIN ZINC 500 UNIT/GM EX OINT
TOPICAL_OINTMENT | Freq: Two times a day (BID) | CUTANEOUS | Status: DC
  Administered 2013-06-19: 21:00:00 via TOPICAL
  Administered 2013-06-20: 1 via TOPICAL
  Filled 2013-06-19: qty 15

## 2013-06-19 MED ORDER — FENTANYL CITRATE 0.05 MG/ML IJ SOLN
INTRAMUSCULAR | Status: AC
  Filled 2013-06-19: qty 5

## 2013-06-19 MED ORDER — ONDANSETRON HCL 4 MG/2ML IJ SOLN: 4.0000 mg | Freq: Once | INTRAMUSCULAR | Status: DC | PRN

## 2013-06-19 MED ORDER — CEFAZOLIN SODIUM-DEXTROSE 2-3 GM-% IV SOLR
2.0000 g | Freq: Four times a day (QID) | INTRAVENOUS | Status: AC
  Administered 2013-06-19 – 2013-06-20 (×3): 2 g via INTRAVENOUS
  Filled 2013-06-19 (×3): qty 50

## 2013-06-19 MED ORDER — MIDAZOLAM HCL 5 MG/5ML IJ SOLN
INTRAMUSCULAR | Status: DC | PRN
  Administered 2013-06-19: 2 mg via INTRAVENOUS

## 2013-06-19 MED ORDER — HYDROMORPHONE HCL PF 1 MG/ML IJ SOLN
INTRAMUSCULAR | Status: AC
  Filled 2013-06-19: qty 2

## 2013-06-19 MED ORDER — OXYCODONE HCL 5 MG PO TABS
ORAL_TABLET | ORAL | Status: AC
  Filled 2013-06-19: qty 1

## 2013-06-19 SURGICAL SUPPLY — 54 items
BANDAGE ELASTIC 4 VELCRO ST LF (GAUZE/BANDAGES/DRESSINGS) IMPLANT
BANDAGE ELASTIC 6 VELCRO ST LF (GAUZE/BANDAGES/DRESSINGS) ×6 IMPLANT
BANDAGE ESMARK 6X9 LF (GAUZE/BANDAGES/DRESSINGS) ×1 IMPLANT
BIT DRILL LONG 4.0 (BIT) ×1 IMPLANT
BIT DRILL SHORT 4.0 (BIT) ×2 IMPLANT
BNDG ESMARK 6X9 LF (GAUZE/BANDAGES/DRESSINGS) ×3
COVER MAYO STAND STRL (DRAPES) ×3 IMPLANT
CUFF TOURNIQUET SINGLE 34IN LL (TOURNIQUET CUFF) IMPLANT
DRAPE C-ARM 42X72 X-RAY (DRAPES) ×3 IMPLANT
DRAPE C-ARMOR (DRAPES) ×3 IMPLANT
DRAPE EXTREMITY T 121X128X90 (DRAPE) ×3 IMPLANT
DRAPE POUCH INSTRU U-SHP 10X18 (DRAPES) ×3 IMPLANT
DRAPE U-SHAPE 47X51 STRL (DRAPES) ×3 IMPLANT
DRAPE UTILITY XL STRL (DRAPES) ×6 IMPLANT
DRILL BIT LONG 4.0 (BIT) ×3
DRILL BIT SHORT 4.0 (BIT) ×4
DRSG MEPILEX BORDER 4X4 (GAUZE/BANDAGES/DRESSINGS) ×9 IMPLANT
DURAPREP 26ML APPLICATOR (WOUND CARE) ×6 IMPLANT
ELECT CAUTERY BLADE 6.4 (BLADE) ×3 IMPLANT
ELECT REM PT RETURN 9FT ADLT (ELECTROSURGICAL) ×3
ELECTRODE REM PT RTRN 9FT ADLT (ELECTROSURGICAL) ×1 IMPLANT
FACESHIELD WRAPAROUND (MASK) ×3 IMPLANT
GAUZE XEROFORM 1X8 LF (GAUZE/BANDAGES/DRESSINGS) ×3 IMPLANT
GLOVE BIOGEL PI IND STRL 6.5 (GLOVE) ×1 IMPLANT
GLOVE BIOGEL PI INDICATOR 6.5 (GLOVE) ×2
GLOVE ECLIPSE 6.5 STRL STRAW (GLOVE) ×3 IMPLANT
GLOVE SURG SS PI 7.5 STRL IVOR (GLOVE) ×12 IMPLANT
GOWN STRL REUS W/ TWL XL LVL3 (GOWN DISPOSABLE) ×2 IMPLANT
GOWN STRL REUS W/TWL XL LVL3 (GOWN DISPOSABLE) ×4
GUIDE PIN 3.2MM (MISCELLANEOUS) ×2
GUIDE PIN ORTH 343X3.2XBRAD (MISCELLANEOUS) ×1 IMPLANT
GUIDE ROD 3.0 (MISCELLANEOUS) ×3
KIT BASIN OR (CUSTOM PROCEDURE TRAY) ×3 IMPLANT
MANIFOLD NEPTUNE II (INSTRUMENTS) ×3 IMPLANT
NAIL TIBIAL 8.5 X 35 (Nail) ×3 IMPLANT
NS IRRIG 1000ML POUR BTL (IV SOLUTION) ×3 IMPLANT
PACK TOTAL JOINT (CUSTOM PROCEDURE TRAY) ×3 IMPLANT
PAD ABD 8X10 STRL (GAUZE/BANDAGES/DRESSINGS) ×3 IMPLANT
PAD CAST 4YDX4 CTTN HI CHSV (CAST SUPPLIES) ×1 IMPLANT
PADDING CAST COTTON 4X4 STRL (CAST SUPPLIES) ×2
PADDING CAST COTTON 6X4 STRL (CAST SUPPLIES) ×3 IMPLANT
ROD GUIDE 3.0 (MISCELLANEOUS) ×1 IMPLANT
SCREW LP CAPTR TRI 4.5X30 (Screw) ×2 IMPLANT
SCREW TRIGEN LOW PROF 4.5X30 (Screw) ×6 IMPLANT
SCREW TRIGEN LOW PROF 4.5X37.5 (Screw) ×3 IMPLANT
SCREW TRIGEN LOW PROF 4.5X42.5 (Screw) ×3 IMPLANT
SPONGE GAUZE 4X4 12PLY (GAUZE/BANDAGES/DRESSINGS) ×3 IMPLANT
STAPLER SKIN PROX WIDE 3.9 (STAPLE) ×3 IMPLANT
SUT VIC AB 0 CT1 27 (SUTURE) ×2
SUT VIC AB 0 CT1 27XBRD ANTBC (SUTURE) ×1 IMPLANT
SUT VIC AB 2-0 CT1 27 (SUTURE) ×4
SUT VIC AB 2-0 CT1 TAPERPNT 27 (SUTURE) ×2 IMPLANT
TOWEL OR 17X26 10 PK STRL BLUE (TOWEL DISPOSABLE) ×6 IMPLANT
WATER STERILE IRR 1000ML POUR (IV SOLUTION) ×3 IMPLANT

## 2013-06-19 NOTE — Anesthesia Postprocedure Evaluation (Signed)
  Anesthesia Post-op Note  Patient: John Hudson  Procedure(s) Performed: Procedure(s): INTRAMEDULLARY (IM) NAIL TIBIAL (Left)  Patient Location: PACU  Anesthesia Type:General  Level of Consciousness: awake, alert  and oriented  Airway and Oxygen Therapy: Patient Spontanous Breathing  Post-op Pain: mild  Post-op Assessment: Post-op Vital signs reviewed, Patient's Cardiovascular Status Stable, Respiratory Function Stable, Patent Airway and Pain level controlled  Post-op Vital Signs: stable  Last Vitals:  Filed Vitals:   06/19/13 1930  BP: 157/81  Pulse: 117  Temp: 36.8 C  Resp: 14    Complications: No apparent anesthesia complications
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
## 2013-06-19 NOTE — Progress Notes (Signed)
Patient getting up for the first time while I was in the room.  Hgb down a bit.  Otherwise stable.  Probably will not be able to go home until tomorrow.  This patient has been seen and I agree with the findings and treatment plan.  Marta LamasJames O. Gae BonWyatt, III, MD, FACS [phone] (pager) [phone] (direct pager) Trauma Surgeon
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
## 2013-06-19 NOTE — Progress Notes (Signed)
Dr Roda ShuttersXu notified of LLE x-ray results.  LLE elevated and ice applied.  New order; x-ray of whole tib/fib.  Nsg to continue to monitor for status changes.

## 2013-06-19 NOTE — Anesthesia Procedure Notes (Signed)
Procedure Name: Intubation Date/Time: 06/19/2013 5:16 PM Performed by: Jerilee HohMUMM, Cleone Hulick N Pre-anesthesia Checklist: Patient identified, Emergency Drugs available, Suction available and Patient being monitored Patient Re-evaluated:Patient Re-evaluated prior to inductionOxygen Delivery Method: Circle system utilized Preoxygenation: Pre-oxygenation with 100% oxygen Intubation Type: IV induction Ventilation: Mask ventilation without difficulty Laryngoscope Size: Mac and 4 Grade View: Grade I Tube type: Oral Tube size: 7.5 mm Number of attempts: 1 Airway Equipment and Method: Stylet Placement Confirmation: ETT inserted through vocal cords under direct vision,  positive ETCO2 and breath sounds checked- equal and bilateral Secured at: 22 cm Tube secured with: Tape Dental Injury: Teeth and Oropharynx as per pre-operative assessment

## 2013-06-19 NOTE — H&P (Signed)
H&P update  The surgical history has been reviewed and remains accurate without interval change.  The patient was re-examined and patient's physiologic condition has not changed significantly in the last 30 days. The condition still exists that makes this procedure necessary. The treatment plan remains the same, without new options for care.  No new pharmacological allergies or types of therapy has been initiated that would change the plan or the appropriateness of the plan.  The patient and/or family understand the potential benefits and risks.  Mayra ReelN. Michael Jamee Keach, MD 06/19/2013 3:30 PM

## 2013-06-19 NOTE — Transfer of Care (Signed)
Immediate Anesthesia Transfer of Care Note  Patient: John Hudson  Procedure(s) Performed: Procedure(s): INTRAMEDULLARY (IM) NAIL TIBIAL (Left)  Patient Location: PACU  Anesthesia Type:General  Level of Consciousness: awake, alert , oriented and patient cooperative  Airway & Oxygen Therapy: Patient Spontanous Breathing and Patient connected to nasal cannula oxygen  Post-op Assessment: Report given to PACU RN, Post -op Vital signs reviewed and stable and Patient moving all extremities  Post vital signs: Reviewed and stable  Complications: No apparent anesthesia complications
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
## 2013-06-19 NOTE — Anesthesia Postprocedure Evaluation (Signed)
Anesthesia Post Note  Patient: John Hudson  Procedure(s) Performed: Procedure(s) (LRB): INTRAMEDULLARY (IM) NAIL FEMORAL (Left)  Anesthesia type: general  Patient location: PACU  Post pain: Pain level controlled  Post assessment: Patient's Cardiovascular Status Stable  Last Vitals:  Filed Vitals:   06/19/13 0631  BP: 145/60  Pulse: 88  Temp: 36.4 C  Resp: 18    Post vital signs: Reviewed and stable  Level of consciousness: sedated  Complications: No apparent anesthesia complications

## 2013-06-19 NOTE — Anesthesia Preprocedure Evaluation (Signed)
Anesthesia Evaluation  Patient identified by MRN, date of birth, ID band Patient awake    Reviewed: Allergy & Precautions, H&P , NPO status , Patient's Chart, lab work & pertinent test results  Airway Mallampati: I TM Distance: >3 FB Neck ROM: Full    Dental  (+) Teeth Intact, Dental Advisory Given   Pulmonary asthma , Current Smoker,  breath sounds clear to auscultation        Cardiovascular Rhythm:Regular Rate:Normal     Neuro/Psych    GI/Hepatic   Endo/Other    Renal/GU      Musculoskeletal   Abdominal   Peds  Hematology   Anesthesia Other Findings   Reproductive/Obstetrics                           Anesthesia Physical Anesthesia Plan  ASA: II  Anesthesia Plan: General   Post-op Pain Management:    Induction: Intravenous  Airway Management Planned: LMA  Additional Equipment:   Intra-op Plan:   Post-operative Plan: Extubation in OR  Informed Consent: I have reviewed the patients History and Physical, chart, labs and discussed the procedure including the risks, benefits and alternatives for the proposed anesthesia with the patient or authorized representative who has indicated his/her understanding and acceptance.   Dental advisory given  Plan Discussed with: CRNA, Anesthesiologist and Surgeon  Anesthesia Plan Comments:         Anesthesia Quick Evaluation

## 2013-06-19 NOTE — Progress Notes (Signed)
OT Cancellation Note  Patient Details Name: John Hudson MRN: [id number] DOB: 1991/01/19   Cancelled Treatment:    Reason Eval/Treat Not Completed: Patient at procedure or test/ unavailable. OT will follow-up with pt to completed assessment of ADL performance.   Rae LipsLeeann M Kasi Lasky [phone] 06/19/2013, 1:24 PM

## 2013-06-20 ENCOUNTER — Encounter (HOSPITAL_COMMUNITY): Payer: Self-pay | Admitting: Orthopaedic Surgery

## 2013-06-20 LAB — CBC
HCT: 24.5 % — ABNORMAL LOW (ref 39.0–52.0)
Hemoglobin: 8.5 g/dL — ABNORMAL LOW (ref 13.0–17.0)
MCH: 31.3 pg (ref 26.0–34.0)
MCHC: 34.7 g/dL (ref 30.0–36.0)
MCV: 90.1 fL (ref 78.0–100.0)
PLATELETS: 126 10*3/uL — AB (ref 150–400)
RBC: 2.72 MIL/uL — ABNORMAL LOW (ref 4.22–5.81)
RDW: 11.7 % (ref 11.5–15.5)
WBC: 5.5 10*3/uL (ref 4.0–10.5)

## 2013-06-20 MED ORDER — OXYCODONE HCL 5 MG PO TABS
10.0000 mg | ORAL_TABLET | ORAL | Status: DC | PRN
  Administered 2013-06-20 (×3): 15 mg via ORAL
  Filled 2013-06-20: qty 3
  Filled 2013-06-20: qty 4
  Filled 2013-06-20: qty 3

## 2013-06-20 MED ORDER — METHOCARBAMOL 500 MG PO TABS: 500.0000 mg | ORAL_TABLET | Freq: Four times a day (QID) | ORAL | Status: DC | PRN

## 2013-06-20 MED ORDER — OXYCODONE-ACETAMINOPHEN 7.5-325 MG PO TABS: 1.0000 | ORAL_TABLET | ORAL | Status: DC | PRN
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
## 2013-06-20 NOTE — Progress Notes (Signed)
Patient c/o pain and feeeling hot. Temp 101.5. Pain med with tylenol included given. Patient instructed on use of incentive spirometer and the importance of coughing and deep breathing. Pt on 2l [place] 97%. Will continue to monitor

## 2013-06-20 NOTE — Progress Notes (Signed)
Patient wants to go home today.  Does not seem to need withdrawal protocol.  IVF running at 125cc per hours.  CPM.  This patient has been seen and I agree with the findings and treatment plan.  Marta LamasJames O. Gae BonWyatt, III, MD, FACS [phone] (pager) [phone] (direct pager) Trauma Surgeon

## 2013-06-20 NOTE — Discharge Instructions (Signed)
1. 50% partial weight bearing to left lower extremity 2. Change dressings as needed 3. May get incisions wet with shower after 10 days after surgery 4. Take lovenox to prevent blood clots  Wash facial wounds daily in shower with soap and water. Do not soak. Apply antibiotic ointment (e.g. Neosporin) twice daily and as needed to keep moist.  No driving while taking oxycodone.

## 2013-06-20 NOTE — Progress Notes (Signed)
Orthopedic Tech Progress Note Patient Details:  Hall BusingKevin Klebba [birth year] [id number] Crutches fitted to patient. Education was done by physical therapy per patient. Ortho Devices Type of Ortho Device: Crutches Ortho Device/Splint Location: crutches fitted to patient for non weight bearing left lower extremity   Early CharsWilliam Anthony Antonius Hartlage 06/20/2013, 4:55 PM

## 2013-06-20 NOTE — ED Provider Notes (Signed)
D/C instructions and scripts given. Lovenox teaching done with Pt and his girlfriend. Pts mom and girlfriendat bedside to take Pt home. Pt verbalized understanding of D/C instructions and how to admin Lovenox.
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
## 2013-06-27 ENCOUNTER — Telehealth (HOSPITAL_COMMUNITY): Payer: Self-pay

## 2013-06-28 NOTE — Telephone Encounter (Signed)
I left message suggesting he get pain meds refilled by Dr. Roda ShuttersXu but that I could prescribe him a limited amount to get him through the weekend if he needed it and for him to call back if so.

## 2013-08-06 ENCOUNTER — Ambulatory Visit: Payer: 59 | Attending: Orthopaedic Surgery

## 2013-08-06 DIAGNOSIS — IMO0001 Reserved for inherently not codable concepts without codable children: Secondary | ICD-10-CM | POA: Insufficient documentation

## 2013-08-06 DIAGNOSIS — R269 Unspecified abnormalities of gait and mobility: Secondary | ICD-10-CM | POA: Insufficient documentation

## 2013-08-06 DIAGNOSIS — M6281 Muscle weakness (generalized): Secondary | ICD-10-CM | POA: Insufficient documentation

## 2013-08-06 DIAGNOSIS — M25569 Pain in unspecified knee: Secondary | ICD-10-CM | POA: Insufficient documentation

## 2013-08-06 DIAGNOSIS — R609 Edema, unspecified: Secondary | ICD-10-CM | POA: Insufficient documentation

## 2013-08-07 ENCOUNTER — Ambulatory Visit: Payer: 59 | Admitting: Physical Therapy

## 2013-08-08 ENCOUNTER — Ambulatory Visit: Payer: 59 | Admitting: Physical Therapy

## 2013-08-12 ENCOUNTER — Ambulatory Visit: Payer: 59 | Admitting: Physical Therapy

## 2013-08-14 ENCOUNTER — Ambulatory Visit: Payer: 59 | Admitting: Physical Therapy

## 2013-08-16 ENCOUNTER — Ambulatory Visit: Payer: 59 | Admitting: Physical Therapy

## 2013-08-19 ENCOUNTER — Ambulatory Visit: Payer: 59

## 2013-08-21 ENCOUNTER — Ambulatory Visit: Payer: 59 | Admitting: Physical Therapy

## 2013-08-23 ENCOUNTER — Ambulatory Visit: Payer: 59

## 2013-08-26 ENCOUNTER — Ambulatory Visit: Payer: 59 | Admitting: Physical Therapy

## 2013-08-28 ENCOUNTER — Ambulatory Visit: Payer: 59 | Attending: Orthopaedic Surgery

## 2013-08-28 DIAGNOSIS — R609 Edema, unspecified: Secondary | ICD-10-CM | POA: Diagnosis not present

## 2013-08-28 DIAGNOSIS — R269 Unspecified abnormalities of gait and mobility: Secondary | ICD-10-CM | POA: Diagnosis not present

## 2013-08-28 DIAGNOSIS — M6281 Muscle weakness (generalized): Secondary | ICD-10-CM | POA: Insufficient documentation

## 2013-08-28 DIAGNOSIS — IMO0001 Reserved for inherently not codable concepts without codable children: Secondary | ICD-10-CM | POA: Insufficient documentation

## 2013-08-28 DIAGNOSIS — M25569 Pain in unspecified knee: Secondary | ICD-10-CM | POA: Insufficient documentation

## 2013-08-29 ENCOUNTER — Ambulatory Visit: Payer: 59

## 2013-08-29 DIAGNOSIS — IMO0001 Reserved for inherently not codable concepts without codable children: Secondary | ICD-10-CM | POA: Diagnosis not present

## 2013-09-02 ENCOUNTER — Ambulatory Visit: Payer: 59

## 2013-09-04 ENCOUNTER — Ambulatory Visit: Payer: 59 | Admitting: Physical Therapy

## 2013-09-06 ENCOUNTER — Encounter: Payer: 59 | Admitting: Physical Therapy
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
## 2014-09-11 IMAGING — CR DG CHEST 1V PORT
1 series · 1 of 1 positions shown · non-contrast
Comparison: DG C-ARM 1-60 MIN dated 06/18/2013; CT CHEST W/CM dated
06/18/2013; DG CHEST 1V PORT dated 06/18/2013

CLINICAL DATA: Pulmonary contusions

EXAM:
PORTABLE CHEST - 1 VIEW

[AP]
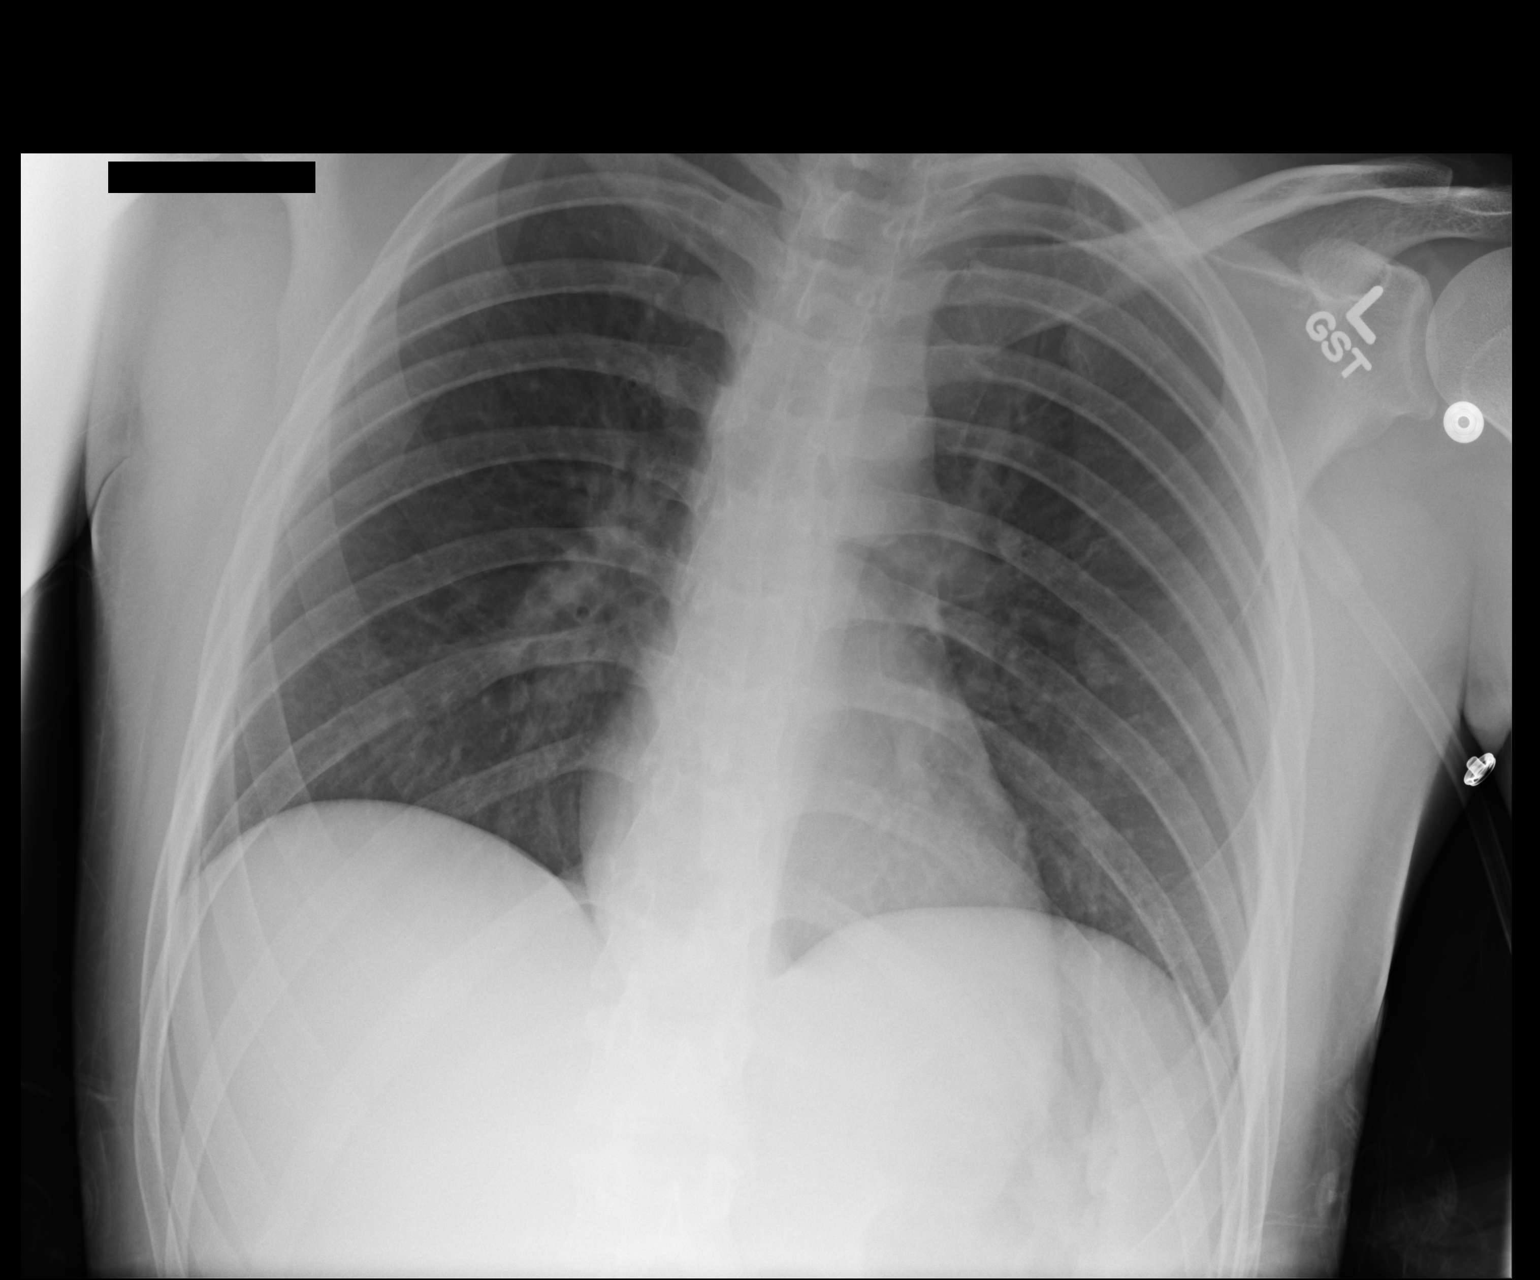

[1 of 1 positions shown; findings below may reference images not displayed]

FINDINGS: The heart size and mediastinal contours are within normal limits.
Both lungs are clear. The visualized skeletal structures are
unremarkable.
IMPRESSION: No active disease.

## 2015-05-30 ENCOUNTER — Encounter (HOSPITAL_COMMUNITY): Payer: Self-pay | Admitting: Emergency Medicine

## 2015-05-30 ENCOUNTER — Emergency Department (HOSPITAL_COMMUNITY)
Admission: EM | Admit: 2015-05-30 | Discharge: 2015-05-30 | Disposition: A | Discharge status: Home / Self Care | Payer: 59 | Attending: Emergency Medicine | Admitting: Emergency Medicine

## 2015-05-30 DIAGNOSIS — F419 Anxiety disorder, unspecified: Secondary | ICD-10-CM | POA: Insufficient documentation

## 2015-05-30 DIAGNOSIS — Y9289 Other specified places as the place of occurrence of the external cause: Secondary | ICD-10-CM | POA: Diagnosis not present

## 2015-05-30 DIAGNOSIS — J45909 Unspecified asthma, uncomplicated: Secondary | ICD-10-CM | POA: Insufficient documentation

## 2015-05-30 DIAGNOSIS — R220 Localized swelling, mass and lump, head: Secondary | ICD-10-CM | POA: Diagnosis present

## 2015-05-30 DIAGNOSIS — X58XXXA Exposure to other specified factors, initial encounter: Secondary | ICD-10-CM | POA: Diagnosis not present

## 2015-05-30 DIAGNOSIS — F17210 Nicotine dependence, cigarettes, uncomplicated: Secondary | ICD-10-CM | POA: Diagnosis not present

## 2015-05-30 DIAGNOSIS — Y9389 Activity, other specified: Secondary | ICD-10-CM | POA: Diagnosis not present

## 2015-05-30 DIAGNOSIS — Y998 Other external cause status: Secondary | ICD-10-CM | POA: Diagnosis not present

## 2015-05-30 DIAGNOSIS — Z79899 Other long term (current) drug therapy: Secondary | ICD-10-CM | POA: Diagnosis not present

## 2015-05-30 DIAGNOSIS — Z8781 Personal history of (healed) traumatic fracture: Secondary | ICD-10-CM | POA: Insufficient documentation

## 2015-05-30 DIAGNOSIS — T781XXA Other adverse food reactions, not elsewhere classified, initial encounter: Secondary | ICD-10-CM | POA: Insufficient documentation

## 2015-05-30 DIAGNOSIS — Z7901 Long term (current) use of anticoagulants: Secondary | ICD-10-CM | POA: Insufficient documentation

## 2015-05-30 MED ORDER — EPINEPHRINE 0.3 MG/0.3ML IJ SOAJ: 0.3000 mg | Freq: Once | INTRAMUSCULAR | Status: AC

## 2015-05-30 MED ORDER — DIPHENHYDRAMINE HCL 50 MG/ML IJ SOLN
25.0000 mg | Freq: Once | INTRAMUSCULAR | Status: AC
  Administered 2015-05-30: 25 mg via INTRAVENOUS
  Filled 2015-05-30: qty 1

## 2015-05-30 MED ORDER — DIPHENHYDRAMINE HCL 25 MG PO TABS: 25.0000 mg | ORAL_TABLET | Freq: Four times a day (QID) | ORAL | Status: AC

## 2015-05-30 MED ORDER — METHYLPREDNISOLONE SODIUM SUCC 125 MG IJ SOLR
125.0000 mg | Freq: Once | INTRAMUSCULAR | Status: AC
  Administered 2015-05-30: 125 mg via INTRAVENOUS
  Filled 2015-05-30: qty 2

## 2015-05-30 MED ORDER — FAMOTIDINE IN NACL 20-0.9 MG/50ML-% IV SOLN
20.0000 mg | Freq: Once | INTRAVENOUS | Status: AC
  Administered 2015-05-30: 20 mg via INTRAVENOUS
  Filled 2015-05-30: qty 50

## 2015-05-30 MED ORDER — PREDNISONE 20 MG PO TABS: 40.0000 mg | ORAL_TABLET | Freq: Every day | ORAL | Status: AC

## 2015-05-30 NOTE — ED Notes (Signed)
Pt complaint of allergic reaction post eating soft shell crab 1300; associated burning sensation inside mouth, throat pain/tightness, stomach pains, and lip swelling; pt continues to report took Claritin at 1400; denies all symptoms only reports "some throat discomfort" at present time. Lung sounds clear; pt speaking complete sentences; no visible swelling present.
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
## 2015-05-30 NOTE — Discharge Instructions (Signed)
Food Allergy A food allergy is an abnormal reaction to a food (food allergen) by the body's defense system (immune system). Foods that commonly cause allergies are:  Milk.  Seafood.  Eggs.  Nuts.  Wheat.  Soy. CAUSES Food allergies happen when the immune system mistakenly sees a food as harmful and releases antibodies to fight it. SIGNS AND SYMPTOMS Symptoms may be mild or severe. They usually start minutes after the food is eaten, but they can occur even a few hours later. In people with a severe allergy, symptoms can start within seconds. Mild Symptoms  Nasal congestion.  Tingling in the mouth.  An itchy, red rash.  Vomiting.  Diarrhea. Severe Symptoms  Swelling of the lips, face, and tongue.  Swelling of the back of the mouth and throat.  Wheezing.  A hoarse voice.  Itchy, red, swollen areas of skin (hives).  Dizziness or light-headedness.  Fainting.  Trouble breathing, speaking, or swallowing.  Chest tightness.  Rapid heartbeat. DIAGNOSIS A diagnosis is made with a physical exam, medical and family history, and one or more of the following:  Skin tests.  Blood tests.  A food diary.  The results of an elimination diet. The elimination diet involves removing foods from your diet and then adding them back in, one at a time. TREATMENT There is no cure for allergies. An allergic reaction can be treated with medicines, such as:  Antihistamines.  Steroids.  Respiratory inhalers.  Epinephrine. Severe symptoms can be a sign of a life-threatening reaction called anaphylaxis, and they require immediate treatment. Severe reactions usually need to be treated at a hospital. People who have had a severe reaction may be prescribed rescue medicines to take if they are accidentally exposed to an allergen. HOME CARE INSTRUCTIONS General Instructions  Avoid the foods that you are allergic to.  Read food labels before you eat packaged items. Look for  ingredients you are allergic to.  When you are at a restaurant, tell your server that you have an allergy. If you are unsure of whether a meal has an ingredient that you are allergic to, ask your server.  Take medicines only as directed by your health care provider. Do not drive until the medicine has worn off, unless your health care provider gives you approval.  Inform all health care providers that you have a food allergy.  Contact your health care provider if you want to be tested for an allergy. If you have had an anaphylactic reaction before, you should never test yourself for an allergy without your health care provider's approval. Instructions for People with Severe Allergies  Wear a medical alert bracelet or necklace that describes your allergy.  Carry your anaphylaxis kit or an epinephrine injection with you at all times. Use them as directed by your health care provider.  Make sure that you, your family members, and your employer know:  How to use an anaphylaxis kit.  How to give an epinephrine injection.  Replace your epinephrine immediately after use, in case you have another reaction.  Seek medical care even after you take epinephrine. This is important because epinephrine can be followed by a delayed, life-threatening reaction. Instructions for People with a Potential Allergy  Follow the elimination diet as directed by your health care provider.  Keep a food diary as directed by your health care provider. Every day, write down:  What you eat and drink and when.  What symptoms you have and when. SEEK MEDICAL CARE IF:  Your symptoms  have not gone away within 2 days. °· Your symptoms get worse. °· You develop new symptoms. °SEEK IMMEDIATE MEDICAL CARE IF: °· You use epinephrine. °· You are having a severe allergic reaction. Symptoms of a severe reaction include: °¨ Swelling of the lips, face, and tongue. °¨ Swelling of the back of the mouth and throat. °¨ Wheezing. °¨ A  hoarse voice. °¨ Hives. °¨ Dizziness or light-headedness. °¨ Fainting. °¨ Trouble breathing, speaking, or swallowing. °¨ Chest tightness. °¨ Rapid heartbeat. °  °This information is not intended to replace advice given to you by your health care provider. Make sure you discuss any questions you have with your health care provider. °  °Document Released: 02/12/2000 Document Revised: 03/07/2014 Document Reviewed: 11/26/2013 °[employer] Interactive Patient Education ©2016 [employer] ° °

## 2015-05-30 NOTE — ED Notes (Signed)
Pt reports eating soft shell crab at 1300 today and then began having swelling to lips and throat. Pt reports taking Claratin at 1400 and then began having improvement in swelling and breathing. Currently pt reports no symptoms. Alert and oriented without any distress.

## 2019-03-05 ENCOUNTER — Ambulatory Visit: Payer: Self-pay | Attending: Internal Medicine

## 2019-03-05 DIAGNOSIS — U071 COVID-19: Secondary | ICD-10-CM | POA: Insufficient documentation

## 2019-03-05 DIAGNOSIS — Z20822 Contact with and (suspected) exposure to covid-19: Secondary | ICD-10-CM

## 2019-03-06 ENCOUNTER — Other Ambulatory Visit: Payer: 59

## 2019-03-07 LAB — NOVEL CORONAVIRUS, NAA: SARS-CoV-2, NAA: DETECTED — AB

## 2024-03-01 ENCOUNTER — Other Ambulatory Visit (HOSPITAL_COMMUNITY): Payer: Self-pay

## 2024-03-01 MED ORDER — SILDENAFIL CITRATE 50 MG PO TABS
50.0000 mg | ORAL_TABLET | ORAL | 1 refills | Status: AC
  Filled 2024-03-01: qty 30, 30d supply, fill #0
